# Patient Record
Sex: Male | Born: 1995 | Race: Black or African American | Hispanic: No | Marital: Single | Smoking: Never smoker
Health system: Southern US, Community
[De-identification: ages and names within clinical notes are randomized; demographics above are authoritative.]

## PROBLEM LIST (undated history)

## (undated) DIAGNOSIS — R41840 Attention and concentration deficit: Secondary | ICD-10-CM

## (undated) DIAGNOSIS — L309 Dermatitis, unspecified: Secondary | ICD-10-CM

---
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
## 2016-09-29 ENCOUNTER — Encounter (HOSPITAL_COMMUNITY): Payer: Self-pay | Admitting: Emergency Medicine

## 2016-09-29 DIAGNOSIS — R1084 Generalized abdominal pain: Secondary | ICD-10-CM | POA: Insufficient documentation

## 2016-09-29 DIAGNOSIS — G8929 Other chronic pain: Secondary | ICD-10-CM | POA: Insufficient documentation

## 2016-09-29 LAB — COMPREHENSIVE METABOLIC PANEL
ALBUMIN: 4.3 g/dL (ref 3.5–5.0)
ALK PHOS: 54 U/L (ref 38–126)
ALT: 11 U/L — ABNORMAL LOW (ref 17–63)
ANION GAP: 8 (ref 5–15)
AST: 27 U/L (ref 15–41)
BILIRUBIN TOTAL: 1.6 mg/dL — AB (ref 0.3–1.2)
BUN: 8 mg/dL (ref 6–20)
CALCIUM: 9.5 mg/dL (ref 8.9–10.3)
CO2: 26 mmol/L (ref 22–32)
Chloride: 102 mmol/L (ref 101–111)
Creatinine, Ser: 1.1 mg/dL (ref 0.61–1.24)
GFR calc Af Amer: >60 mL/min (ref >60)
GLUCOSE: 166 mg/dL — AB (ref 65–99)
Potassium: 3.7 mmol/L (ref 3.5–5.1)
Sodium: 136 mmol/L (ref 135–145)
TOTAL PROTEIN: 7.2 g/dL (ref 6.5–8.1)

## 2016-09-29 LAB — URINALYSIS, ROUTINE W REFLEX MICROSCOPIC
Bilirubin Urine: NEGATIVE
GLUCOSE, UA: NEGATIVE mg/dL
HGB URINE DIPSTICK: NEGATIVE
Ketones, ur: 20 mg/dL — AB
Leukocytes, UA: NEGATIVE
NITRITE: NEGATIVE
Protein, ur: 30 mg/dL — AB
SPECIFIC GRAVITY, URINE: 1.025 (ref 1.005–1.030)
Squamous Epithelial / LPF: NONE SEEN
pH: 6 (ref 5.0–8.0)

## 2016-09-29 LAB — CBC
HCT: 44 % (ref 39.0–52.0)
HEMOGLOBIN: 15 g/dL (ref 13.0–17.0)
MCH: 31.5 pg (ref 26.0–34.0)
MCHC: 34.1 g/dL (ref 30.0–36.0)
MCV: 92.4 fL (ref 78.0–100.0)
Platelets: 210 10*3/uL (ref 150–400)
RBC: 4.76 MIL/uL (ref 4.22–5.81)
RDW: 12.2 % (ref 11.5–15.5)
WBC: 7.6 10*3/uL (ref 4.0–10.5)

## 2016-09-29 LAB — LIPASE, BLOOD: Lipase: 25 U/L (ref 11–51)

## 2016-09-29 NOTE — ED Triage Notes (Addendum)
Pt c/o abdominal pain with diarrhea x 1 year. States, "i think I have a tape worm". Denies nausea/vomiting. States he has lost a significant amount of weight in the last year.

## 2016-09-29 NOTE — ED Notes (Signed)
Patient up to [facility] inquiring about wait time, upset of extended wait times. Encouraged patient to stay since he has waited this long. Patient will stay for now,

## 2016-09-30 ENCOUNTER — Emergency Department (HOSPITAL_COMMUNITY)
Admission: EM | Admit: 2016-09-30 | Discharge: 2016-09-30 | Disposition: A | Discharge status: Home / Self Care | Payer: Self-pay | Attending: Emergency Medicine | Admitting: Emergency Medicine

## 2016-09-30 DIAGNOSIS — R1084 Generalized abdominal pain: Secondary | ICD-10-CM

## 2016-09-30 DIAGNOSIS — R109 Unspecified abdominal pain: Secondary | ICD-10-CM

## 2016-09-30 DIAGNOSIS — G8929 Other chronic pain: Secondary | ICD-10-CM

## 2016-09-30 NOTE — ED Provider Notes (Signed)
[facility] Provider Note   CSN: [id number] Arrival date & time: 09/29/16  1640     History   Chief Complaint Chief Complaint  Patient presents with  . Abdominal Pain    HPI Joseph Sawyer is a 21 y.o. male.  Patient presents with symptoms of diffuse, often right sided abdominal pain that is cramping in nature for greater than one year. Symptoms are intermittent. He reports regular bowel movements that alternate between loose and constipated. No melena or hematochezia. No fever at any time. No nausea or vomiting. He reports that his weight fluctuates +/- 20 pounds over time. No rapid weight loss. Symptoms are not related to eating or change in diet. No urinary symptoms, chest pain, SOB, cough.    The history is provided by the patient. No language interpreter was used.  Abdominal Pain   Associated symptoms include diarrhea and constipation. Pertinent negatives include fever, nausea and vomiting.    Past Medical History:  Diagnosis Date  . Attention deficit   . Eczema     There are no active problems to display for this patient.   History reviewed. No pertinent surgical history.     Home Medications    Prior to Admission medications   Medication Sig Start Date End Date Taking? Authorizing Provider  cetirizine (ZYRTEC) 10 MG tablet Take 10 mg by mouth daily as needed for allergies.   Yes [provider]    Family History No family history on file.  Social History Social History  Substance Use Topics  . Smoking status: Never Smoker  . Smokeless tobacco: Never Used  . Alcohol use No     Allergies   Patient has no known allergies.   Review of Systems Review of Systems  Constitutional: Negative for chills and fever.  Respiratory: Negative.  Negative for shortness of breath.   Cardiovascular: Negative.   Gastrointestinal: Positive for abdominal pain, constipation and diarrhea. Negative for nausea and vomiting.  Genitourinary:  Negative.   Musculoskeletal: Negative.   Skin: Negative.   Neurological: Negative.      Physical Exam Updated Vital Signs BP 125/72 (BP Location: Right Arm)   Pulse 70   Temp 98.5 F (36.9 C) (Oral)   Resp 16   Ht  (1.727 m)   Wt 68 kg (150 lb)   SpO2 100%   BMI 22.81 kg/m   Physical Exam  Constitutional: He is oriented to person, place, and time. He appears well-developed and well-nourished.  HENT:  Head: Normocephalic.  Neck: Normal range of motion. Neck supple.  Cardiovascular: Normal rate and regular rhythm.   Pulmonary/Chest: Effort normal and breath sounds normal.  Abdominal: Soft. Bowel sounds are normal. There is no tenderness. There is no rebound and no guarding.  Musculoskeletal: Normal range of motion.  Neurological: He is alert and oriented to person, place, and time.  Skin: Skin is warm and dry. No rash noted.  Psychiatric: He has a normal mood and affect.     ED Treatments / Results  Labs (all labs ordered are listed, but only abnormal results are displayed) Labs Reviewed  COMPREHENSIVE METABOLIC PANEL - Abnormal; Notable for the following:       Result Value   Glucose, Bld 166 (*)    ALT 11 (*)    Total Bilirubin 1.6 (*)    All other components within normal limits  URINALYSIS, ROUTINE W REFLEX MICROSCOPIC - Abnormal; Notable for the following:    Color, Urine AMBER (*)  Ketones, ur 20 (*)    Protein, ur 30 (*)    Bacteria, UA RARE (*)    All other components within normal limits  LIPASE, BLOOD  CBC    EKG  EKG Interpretation None       Radiology No results found.  Procedures Procedures (including critical care time)  Medications Ordered in ED Medications - No data to display   Initial Impression / Assessment and Plan / ED Course  I have reviewed the triage vital signs and the nursing notes.  Pertinent labs & imaging results that were available during my care of the patient were reviewed by me and considered in my  medical decision making (see chart for details).     Well appearing patient with chronic intermittent abdominal cramping pain for the past greater than 1 years. Labs are negative for acute finding. Likely IBS. Will refer to GI for further management.   Final Clinical Impressions(s) / ED Diagnoses   Final diagnoses:  None   1. Abdominal pain, chronic  New Prescriptions New Prescriptions   No medications on file     Danne Harbor 09/30/16 1610    Zadie Rhine, MD 09/30/16 [phone]

## 2016-10-20 ENCOUNTER — Ambulatory Visit (HOSPITAL_COMMUNITY)
Admission: EM | Admit: 2016-10-20 | Discharge: 2016-10-20 | Disposition: A | Discharge status: Home / Self Care | Payer: Self-pay

## 2016-10-20 ENCOUNTER — Encounter (HOSPITAL_COMMUNITY): Payer: Self-pay | Admitting: Emergency Medicine

## 2016-10-20 DIAGNOSIS — M545 Low back pain, unspecified: Secondary | ICD-10-CM

## 2016-10-20 DIAGNOSIS — G8929 Other chronic pain: Secondary | ICD-10-CM

## 2016-10-20 MED ORDER — IBUPROFEN 800 MG PO TABS
800.0000 mg | ORAL_TABLET | Freq: Three times a day (TID) | ORAL | 0 refills | Status: AC
Start: 2016-10-20 — End: ?

## 2016-10-20 NOTE — ED Provider Notes (Signed)
[facility]    CSN: [id number] Arrival date & time: 10/20/16  1716     History   Chief Complaint Chief Complaint  Patient presents with  . Back Pain    HPI Joseph Sawyer is a 21 y.o. male.    Back Pain  Location:  Lumbar spine Quality:  Aching Radiates to:  Does not radiate Pain severity:  Severe Pain is:  Worse during the day Onset quality:  Sudden Duration:  2 days Timing:  Constant Chronicity:  New Context: not falling and not MVA   Relieved by:  NSAIDs Worsened by:  Nothing Associated symptoms: no abdominal pain, no chest pain, no fever and no weakness     Past Medical History:  Diagnosis Date  . Attention deficit   . Eczema     There are no active problems to display for this patient.   History reviewed. No pertinent surgical history.     Home Medications    Prior to Admission medications   Medication Sig Start Date End Date Taking? Authorizing Provider  ibuprofen (ADVIL,MOTRIN) 400 MG tablet Take 400 mg by mouth every 6 (six) hours as needed.   Yes [provider]  cetirizine (ZYRTEC) 10 MG tablet Take 10 mg by mouth daily as needed for allergies.    [provider]    Family History No family history on file.  Social History Social History  Substance Use Topics  . Smoking status: Never Smoker  . Smokeless tobacco: Never Used  . Alcohol use No     Allergies   Patient has no known allergies.   Review of Systems Review of Systems  Constitutional: Negative for fever.  Cardiovascular: Negative for chest pain.  Gastrointestinal: Negative for abdominal pain.  Musculoskeletal: Positive for back pain.  Neurological: Negative for weakness.     Physical Exam Triage Vital Signs ED Triage Vitals  Enc Vitals Group     BP 10/20/16 1744 108/68     Pulse Rate 10/20/16 1744 (!) 106     Resp 10/20/16 1744 18     Temp 10/20/16 1744 98.2 F (36.8 C)     Temp Source 10/20/16 1744 Oral     SpO2 10/20/16  1744 100 %     Weight --      Height --      Head Circumference --      Peak Flow --      Pain Score 10/20/16 1741 10     Pain Loc --      Pain Edu? --      Excl. in GC? --    No data found.   Updated Vital Signs BP 108/68 (BP Location: Right Arm)   Pulse (!) 106   Temp 98.2 F (36.8 C) (Oral)   Resp 18   SpO2 100%    Physical Exam  Constitutional: He is oriented to person, place, and time. He appears well-developed. He does not appear ill.  Eyes: Pupils are equal, round, and reactive to light. Conjunctivae and EOM are normal.  Cardiovascular: Normal rate.   Pulmonary/Chest: Effort normal.  Abdominal: He exhibits no distension.  Musculoskeletal: Normal range of motion. He exhibits tenderness. Edema: bilateral lumbar paraspinal tenderness.  Neurological: He is alert and oriented to person, place, and time. No cranial nerve deficit. Coordination normal.  Skin: Skin is warm and dry. He is not diaphoretic.  Psychiatric: He has a normal mood and affect.  Nursing note and vitals reviewed.    UC Treatments /  Results  Labs (all labs ordered are listed, but only abnormal results are displayed) Labs Reviewed - No data to display  EKG  EKG Interpretation None       Radiology No results found.  Procedures Procedures (including critical care time)  Medications Ordered in UC Medications - No data to display   Initial Impression / Assessment and Plan / UC Course  I have reviewed the triage vital signs and the nursing notes.  Pertinent labs & imaging results that were available during my care of the patient were reviewed by me and considered in my medical decision making (see chart for details).     Mechanical back pain.  Ibuprofen for now.  RTC in three weeks is not improving, sooner if worse.   Final Clinical Impressions(s) / UC Diagnoses   Final diagnoses:  None    New Prescriptions New Prescriptions   No medications on file     Controlled Substance  Prescriptions [place] Controlled Substance Registry consulted? Not Applicable   Ofilia Neas, PA-C 10/20/16 1805

## 2016-10-20 NOTE — ED Triage Notes (Signed)
Yesterday morning started having back spasms. Patient had a 10 hour bus ride prior to this.  Pain in both legs.  Pain is in anterior thighs.  No known injury

## 2016-10-20 NOTE — Discharge Instructions (Signed)
Give this a few weeks to go away with meds.  If you get worse come back sooner.
